# Patient Record
Sex: Male | Born: 1965 | Race: White | Hispanic: Yes | Smoking: Current every day smoker
Health system: Southern US, Community
[De-identification: ages and names within clinical notes are randomized; demographics above are authoritative.]

---

## 2013-12-10 ENCOUNTER — Emergency Department (HOSPITAL_COMMUNITY): Payer: Self-pay

## 2013-12-10 ENCOUNTER — Encounter (HOSPITAL_COMMUNITY): Payer: Self-pay | Admitting: Emergency Medicine

## 2013-12-10 ENCOUNTER — Emergency Department (HOSPITAL_COMMUNITY)
Admission: EM | Admit: 2013-12-10 | Discharge: 2013-12-11 | Disposition: A | Discharge status: Home / Self Care | Payer: Self-pay | Attending: Emergency Medicine | Admitting: Emergency Medicine

## 2013-12-10 DIAGNOSIS — F10920 Alcohol use, unspecified with intoxication, uncomplicated: Secondary | ICD-10-CM

## 2013-12-10 DIAGNOSIS — S298XXA Other specified injuries of thorax, initial encounter: Secondary | ICD-10-CM | POA: Insufficient documentation

## 2013-12-10 DIAGNOSIS — Y92410 Unspecified street and highway as the place of occurrence of the external cause: Secondary | ICD-10-CM | POA: Insufficient documentation

## 2013-12-10 DIAGNOSIS — S20219A Contusion of unspecified front wall of thorax, initial encounter: Secondary | ICD-10-CM | POA: Insufficient documentation

## 2013-12-10 DIAGNOSIS — F1010 Alcohol abuse, uncomplicated: Secondary | ICD-10-CM | POA: Insufficient documentation

## 2013-12-10 DIAGNOSIS — Y9389 Activity, other specified: Secondary | ICD-10-CM | POA: Insufficient documentation

## 2013-12-10 DIAGNOSIS — R9431 Abnormal electrocardiogram [ECG] [EKG]: Secondary | ICD-10-CM | POA: Insufficient documentation

## 2013-12-10 DIAGNOSIS — F17200 Nicotine dependence, unspecified, uncomplicated: Secondary | ICD-10-CM | POA: Insufficient documentation

## 2013-12-10 DIAGNOSIS — S0990XA Unspecified injury of head, initial encounter: Secondary | ICD-10-CM | POA: Insufficient documentation

## 2013-12-10 LAB — CBC WITH DIFFERENTIAL/PLATELET
BASOS PCT: 0 % (ref 0–1)
Basophils Absolute: 0 10*3/uL (ref 0.0–0.1)
EOS ABS: 0.1 10*3/uL (ref 0.0–0.7)
Eosinophils Relative: 2 % (ref 0–5)
HCT: 42.8 % (ref 39.0–52.0)
Hemoglobin: 15 g/dL (ref 13.0–17.0)
LYMPHS ABS: 2.1 10*3/uL (ref 0.7–4.0)
Lymphocytes Relative: 35 % (ref 12–46)
MCH: 32.5 pg (ref 26.0–34.0)
MCHC: 35 g/dL (ref 30.0–36.0)
MCV: 92.6 fL (ref 78.0–100.0)
Monocytes Absolute: 0.5 10*3/uL (ref 0.1–1.0)
Monocytes Relative: 8 % (ref 3–12)
NEUTROS PCT: 55 % (ref 43–77)
Neutro Abs: 3.3 10*3/uL (ref 1.7–7.7)
Platelets: 202 10*3/uL (ref 150–400)
RBC: 4.62 MIL/uL (ref 4.22–5.81)
RDW: 12.9 % (ref 11.5–15.5)
WBC: 6 10*3/uL (ref 4.0–10.5)

## 2013-12-10 LAB — COMPREHENSIVE METABOLIC PANEL
ALBUMIN: 3.9 g/dL (ref 3.5–5.2)
ALK PHOS: 68 U/L (ref 39–117)
ALT: 30 U/L (ref 0–53)
AST: 45 U/L — ABNORMAL HIGH (ref 0–37)
Anion gap: 15 (ref 5–15)
BUN: 6 mg/dL (ref 6–23)
CO2: 25 mEq/L (ref 19–32)
Calcium: 8.9 mg/dL (ref 8.4–10.5)
Chloride: 105 mEq/L (ref 96–112)
Creatinine, Ser: 0.86 mg/dL (ref 0.50–1.35)
GFR calc Af Amer: >90 mL/min (ref >90)
GFR calc non Af Amer: >90 mL/min (ref >90)
Glucose, Bld: 104 mg/dL — ABNORMAL HIGH (ref 70–99)
POTASSIUM: 3.4 meq/L — AB (ref 3.7–5.3)
SODIUM: 145 meq/L (ref 137–147)
TOTAL PROTEIN: 7.8 g/dL (ref 6.0–8.3)
Total Bilirubin: 0.3 mg/dL (ref 0.3–1.2)

## 2013-12-10 LAB — ETHANOL: Alcohol, Ethyl (B): 238 mg/dL — ABNORMAL HIGH (ref 0–11)

## 2013-12-10 LAB — I-STAT TROPONIN, ED: TROPONIN I, POC: 0 ng/mL (ref 0.00–0.08)

## 2013-12-10 LAB — TROPONIN I: Troponin I: <0.3 ng/mL (ref <0.30)

## 2013-12-10 MED ORDER — IOHEXOL 350 MG/ML SOLN
100.0000 mL | Freq: Once | INTRAVENOUS | Status: AC | PRN
  Administered 2013-12-10: 100 mL via INTRAVENOUS

## 2013-12-10 MED ORDER — SODIUM CHLORIDE 0.9 % IV SOLN
INTRAVENOUS | Status: DC
  Administered 2013-12-10: 21:00:00 via INTRAVENOUS

## 2013-12-10 MED ORDER — FENTANYL CITRATE 0.05 MG/ML IJ SOLN
50.0000 ug | Freq: Once | INTRAMUSCULAR | Status: AC
  Administered 2013-12-10: 50 ug via INTRAVENOUS
  Filled 2013-12-10: qty 2
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
## 2013-12-11 MED ORDER — IBUPROFEN 600 MG PO TABS: 600.0000 mg | ORAL_TABLET | Freq: Four times a day (QID) | ORAL | Status: AC | PRN

## 2013-12-11 MED ORDER — HYDROCODONE-ACETAMINOPHEN 5-325 MG PO TABS: 1.0000 | ORAL_TABLET | ORAL | Status: AC | PRN

## 2013-12-11 NOTE — Discharge Instructions (Signed)
Intoxicacin alcohlica (Alcohol Intoxication) La intoxicacin alcohlica se produce cuando la cantidad de alcohol que se ha consumido daa la capacidad de funcionamiento mental y fsico. El alcohol deteriora directamente la actividad qumica normal del cerebro. Beber grandes cantidades de alcohol puede conducir a Insurance underwriter funcionamiento mental y en el comportamiento, y puede causar muchos efectos fsicos que pueden ser perjudiciales.  La intoxicacin alcohlica puede variar en gravedad desde leve hasta muy grave. Hay varios factores que pueden afectar el nivel de intoxicacin que se produce, como la edad de la persona, el sexo, el peso, la frecuencia de consumo de alcohol, y la presencia de otras enfermedades mdicas (como diabetes, convulsiones o enfermedades del corazn). Los niveles peligrosos de intoxicacin por alcohol pueden ocurrir [employer] personas beben grandes cantidades de alcohol en un corto periodo de tiempo ([place]). El alcohol tambin puede ser especialmente peligroso cuando se combina con ciertos medicamentos recetados o drogas "recreativas". SIGNOS Y SNTOMAS Algunos de los signos y sntomas comunes de intoxicacin leve por alcohol incluyen:  Prdida de la coordinacin.  Cambios en el estado de nimo y la conducta.  Incapacidad para razonar.  Hablar arrastrando las palabras. A medida que la intoxicacin por alcohol avanza a niveles ms graves, Lucianne Lei a Arts administrator otros signos y sntomas. Estos pueden ser:  Vmitos.  Confusin y alteracin de Sales promotion account executive.  Disminucin de Secretary/administrator.  Convulsiones.  Prdida de la conciencia. DIAGNSTICO  El mdico le har una historia clnica y un examen fsico. Se le preguntar acerca de la cantidad y el tipo de alcohol que ha consumido. Se le realizarn anlisis de sangre para medir la concentracin de alcohol en sangre. En muchos lugares, el nivel de alcohol en la sangre debe ser inferior a 80 mg / dL (0,08%) para  poder conducir legalmente. Sin embargo, hay muchos efectos peligrosos del alcohol que pueden ocurrir con niveles mucho ms bajos.  [place] con intoxicacin por alcohol a menudo no requieren Clinical research associate. La mayor parte de los efectos de la intoxicacin por alcohol son temporales, y desaparecen a medida que el alcohol abandona el cuerpo de forma natural. El profesional controlar su estado hasta que est lo suficientemente estable como para volver a casa. A veces se administran lquidos por va intravenosa para ayudar a evitar la deshidratacin.  INSTRUCCIONES PARA EL CUIDADO EN EL HOGAR  No conduzca vehculos despus de beber alcohol.  Mantngase hidratado. Beba gran cantidad de lquido para mantener la orina de tono claro o color amarillo plido. Evite la cafena.   Tome slo medicamentos de venta libre o recetados, segn las indicaciones del mdico.  SOLICITE ATENCIN MDICA SI:   Tiene vmitos persistentes.   No mejora luego de [employer].  Se intoxica con alcohol con frecuencia. El mdico podr ayudarlo a decidir si debe consultar a un terapeuta especializado en el abuso de sustancias. SOLICITE ATENCIN MDICA DE INMEDIATO SI:   Se siente vacilante o tembloroso cuando trata de abandonar el hbito.   Comienza a temblar de manera incontrolable (convulsiones).   Vomita sangre. Puede ser sangre de color rojo brillante o similar al sedimento del caf negro.   Lollie Marrow en la materia fecal. Puede ser de color rojo brillante o de aspecto alquitranado, con olor ftido.   Se siente mareado o se desmaya.  ASEGRESE DE QUE:   Comprende estas instrucciones.  Controlar su afeccin.  Recibir ayuda de inmediato si no mejora o si empeora. Document Released: 04/07/2005 Document Revised: 12/08/2012 ExitCare Patient Information  2015 [employer]. This information is not intended to replace advice given to you by your health care provider. Make sure you  discuss any questions you have with your health care provider.  Contusin en el trax  (Chest Contusion)  Una contusin en el trax es un hematoma profundo en esa zona. Las contusiones son el resultado de una lesin que causa sangrado debajo de la piel. Puede causar un hematoma en la piel, los msculos o las costillas. La zona de la contusin puede ponerse [place], [place] o [place]. Las lesiones menores no causan Engineer, mining, Biomedical engineer las ms graves pueden presentar dolor e inflamacin durante un par de semanas. CAUSAS  La causa de la contusin generalmente es un golpe, un traumatismo o una fuerza directa ejercida sobre una zona del cuerpo.  SNTOMAS   Hinchazn y enrojecimiento en la zona lesionada.  Cambios de coloracin de la piel en esa zona.  Sensibilidad y Art therapist.  Dolor. DIAGNSTICO  El diagnstico puede hacerse realizando una historia clnica y un examen fsico. Podra ser necesario tomar una radiografa, tomografa computada (TC) o una resonancia magntica (RMN) para determinar si hubo lesiones asociadas, como por ejemplo huesos rotos (fracturas) o lesiones internas.  TRATAMIENTO  El mejor tratamiento para la contusin en el trax es el reposo, la aplicacin de hielo y compresas fras en la zona de la lesin. Podrn indicarle ejercicios de respiracin profunda para reducir el riesgo de neumona. Para calmar el dolor tambin podrn indicarle medicamentos de venta libre.  INSTRUCCIONES PARA EL CUIDADO EN EL HOGAR   Aplique hielo sobre la zona lesionada.  Ponga el hielo en una bolsa plstica.  Colquese una toalla entre la piel y la bolsa de hielo.  Deje el hielo durante 15 a 20 minutos, 3 a 4 veces por da.  Tome slo medicamentos de venta libre o recetados, segn las indicaciones del mdico. El mdico podr indicarle que evite tomar antiinflamatorios (aspirina, ibuprofeno y naproxeno) durante 48 horas ya que estos medicamentos pueden aumentar los hematomas.  Haga que la zona  lesionada repose.  Haga ejercicios de respiracin profunda segn las indicaciones de su mdico.  Si fuma, abandone el hbito.  No levante objetos ms pesados que 5 libras (2.3 kg.) durante 3 das o ms, si se lo indican. SOLICITE ATENCIN MDICA DE INMEDIATO SI:   El hematoma o la hinchazn aumentan.  Siente dolor que [place].  Tiene dificultad para respirar.  Se siente mareado, dbil o se desmaya.  Observa sangre en la orina.  Tose o vomita sangre.  La hinchazn o el dolor no se [employer]. ASEGRESE DE QUE:   Comprende estas instrucciones.  Controlar su enfermedad.  Solicitar ayuda de inmediato si no mejora o si empeora. Document Released: 01/15/2005 Document Revised: 12/31/2011 [facility] Patient Information 2015 [place], [place]. This information is not intended to replace advice given to you by your health care provider. Make sure you discuss any questions you have with your health care provider.
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
